# Patient Record
Sex: Male | Born: 1970 | Race: White | Hispanic: No | Marital: Single | Smoking: Never smoker
Health system: Southern US, Community
[De-identification: ages and names within clinical notes are randomized; demographics above are authoritative.]

## PROBLEM LIST (undated history)

## (undated) DIAGNOSIS — T7840XA Allergy, unspecified, initial encounter: Secondary | ICD-10-CM

## (undated) DIAGNOSIS — M1990 Unspecified osteoarthritis, unspecified site: Secondary | ICD-10-CM

## (undated) HISTORY — DX: Unspecified osteoarthritis, unspecified site: M19.90

## (undated) HISTORY — DX: Allergy, unspecified, initial encounter: T78.40XA

---

## 2003-10-09 ENCOUNTER — Emergency Department (HOSPITAL_COMMUNITY): Admission: EM | Admit: 2003-10-09 | Discharge: 2003-10-10 | Payer: Self-pay | Admitting: Emergency Medicine

## 2021-08-14 ENCOUNTER — Ambulatory Visit (INDEPENDENT_AMBULATORY_CARE_PROVIDER_SITE_OTHER): Payer: 59

## 2021-08-14 ENCOUNTER — Ambulatory Visit
Admission: EM | Admit: 2021-08-14 | Discharge: 2021-08-14 | Disposition: A | Discharge status: Home / Self Care | Payer: 59 | Attending: Internal Medicine | Admitting: Internal Medicine

## 2021-08-14 DIAGNOSIS — M25561 Pain in right knee: Secondary | ICD-10-CM

## 2021-08-14 NOTE — ED Provider Notes (Signed)
[name]    CSN: [id number] Arrival date & time: 08/14/21  1311      History   Chief Complaint Chief Complaint  Patient presents with   right knee pain    HPI Jaime Walters is a 51 y.o. male.   Patient presents with right knee pain that has been intermittent over the past 3 weeks.  Denies any apparent recent injury.  Patient has taken 2 doses of ibuprofen since symptoms started with minimal improvement.  Denies any numbness or tingling.  Pain is exacerbated with movement and bearing weight.  Patient does report that he fractured that leg multiple years prior so he does have a rod in his leg.    History reviewed. No pertinent past medical history.  There are no problems to display for this patient.   History reviewed. No pertinent surgical history.     Home Medications    Prior to Admission medications   Not on File    Family History History reviewed. No pertinent family history.  Social History Social History   Tobacco Use   Smoking status: Never   Smokeless tobacco: Never     Allergies   Patient has no allergy information on record.   Review of Systems Review of Systems Per HPI  Physical Exam Triage Vital Signs ED Triage Vitals [08/14/21 1425]  Enc Vitals Group     BP (!) 146/86     Pulse Rate 100     Resp 18     Temp 98 F (36.7 C)     Temp Source Oral     SpO2 96 %     Weight      Height      Head Circumference      Peak Flow      Pain Score 0     Pain Loc      Pain Edu?      Excl. in GC?    No data found.  Updated Vital Signs BP (!) 146/86 (BP Location: Left Arm)   Pulse 100   Temp 98 F (36.7 C) (Oral)   Resp 18   SpO2 96%   Visual Acuity Right Eye Distance:   Left Eye Distance:   Bilateral Distance:    Right Eye Near:   Left Eye Near:    Bilateral Near:     Physical Exam Constitutional:      General: He is not in acute distress.    Appearance: Normal appearance. He is not toxic-appearing or  diaphoretic.  HENT:     Head: Normocephalic and atraumatic.  Eyes:     Extraocular Movements: Extraocular movements intact.     Conjunctiva/sclera: Conjunctivae normal.  Pulmonary:     Effort: Pulmonary effort is normal.  Musculoskeletal:     Comments: No tenderness to palpation generalized throughout right knee.  No obvious swelling or discoloration noted.  No crepitus noted.  Patient has full range of motion of knee.  Neurovascular intact  Neurological:     General: No focal deficit present.     Mental Status: He is alert and oriented to person, place, and time. Mental status is at baseline.  Psychiatric:        Mood and Affect: Mood normal.        Behavior: Behavior normal.        Thought Content: Thought content normal.        Judgment: Judgment normal.     UC Treatments / Results  Labs (all  labs ordered are listed, but only abnormal results are displayed) Labs Reviewed - No data to display  EKG   Radiology DG Knee Complete 4 Views Right  Result Date: 08/14/2021 CLINICAL DATA:  Right knee pain for the past week.  No injury. EXAM: RIGHT KNEE - COMPLETE 4+ VIEW COMPARISON:  None Available. FINDINGS: No acute fracture or dislocation. Incompletely visualized healed femoral shaft fracture status post intramedullary rod placement. No joint effusion. Mild medial compartment joint space narrowing with small marginal osteophytes. Soft tissues are unremarkable. IMPRESSION: 1. No acute osseous abnormality. 2. Mild medial compartment osteoarthritis. Electronically Signed   By: Obie Dredge M.D.   On: 08/14/2021 15:18    Procedures Procedures (including critical care time)  Medications Ordered in UC Medications - No data to display  Initial Impression / Assessment and Plan / UC Course  I have reviewed the triage vital signs and the nursing notes.  Pertinent labs & imaging results that were available during my care of the patient were reviewed by me and considered in my medical  decision making (see chart for details).     Right knee x-ray showing mild medial compartment osteoarthritis.  This could be contributing to patient's knee pain.  Advised anti-inflammatory medications, ice application, supportive care with patient.  Patient was offered prednisone steroid but declined as he wishes to take NSAIDs.  Patient to follow-up with provided contact information for orthopedist if pain persists or worsens.  Patient was given strict return precautions.  Patient verbalized understanding and was agreeable with plan. Final Clinical Impressions(s) / UC Diagnoses   Final diagnoses:  Acute pain of right knee     Discharge Instructions      You may take ibuprofen for your knee pain.  Also apply ice to area.  Follow-up with orthopedist.    ED Prescriptions   None    PDMP not reviewed this encounter.   Gustavus Bryant, [place] 08/14/21 [phone]

## 2021-08-14 NOTE — Discharge Instructions (Signed)
You may take ibuprofen for your knee pain.  Also apply ice to area.  Follow-up with orthopedist.

## 2021-08-14 NOTE — ED Triage Notes (Signed)
Pt c/o right knee injury denies injury x 1 week

## 2021-10-20 ENCOUNTER — Ambulatory Visit
Admission: EM | Admit: 2021-10-20 | Discharge: 2021-10-20 | Disposition: A | Discharge status: Home / Self Care | Payer: 59 | Attending: Physician Assistant | Admitting: Physician Assistant

## 2021-10-20 DIAGNOSIS — R310 Gross hematuria: Secondary | ICD-10-CM | POA: Diagnosis present

## 2021-10-20 LAB — POCT URINALYSIS DIP (MANUAL ENTRY)
Bilirubin, UA: NEGATIVE
Glucose, UA: NEGATIVE mg/dL
Ketones, POC UA: NEGATIVE mg/dL
Leukocytes, UA: NEGATIVE
Nitrite, UA: NEGATIVE
Protein Ur, POC: 100 mg/dL — AB
Spec Grav, UA: >=1.03 — AB (ref 1.010–1.025)
Urobilinogen, UA: 0.2 E.U./dL
pH, UA: 5.5 (ref 5.0–8.0)

## 2021-10-20 NOTE — ED Provider Notes (Signed)
[name]    CSN: [id number] Arrival date & time: 10/20/21  1201      History   Chief Complaint Chief Complaint  Patient presents with   Hematuria    HPI Jaime Walters is a 51 y.o. male.   Patient presents today accompanied by his wife who provide the majority of history.  Reports for the last week he has had abnormal urine color.  Reports that he has had frank hematuria as well as occasional black flakes and white flakes in his urine.  He denies any pain, dysuria, back pain, nausea, vomiting, swelling.  He denies history of UTI or urological condition.  Denies any recent catheterization or trauma.  He does not take any blood thinning medications.  He denies any recent illness.  Denies history of kidney disease.  He reports that symptoms have gradually been improving but he continues to have intermittent symptoms prompting evaluation today.  He has not seen a urologist in the past.  Denies history of nephrolithiasis.  Denies any flank pain, abdominal pain, fever.    History reviewed. No pertinent past medical history.  There are no problems to display for this patient.   History reviewed. No pertinent surgical history.     Home Medications    Prior to Admission medications   Not on File    Family History History reviewed. No pertinent family history.  Social History Social History   Tobacco Use   Smoking status: Never   Smokeless tobacco: Never     Allergies   Patient has no allergy information on record.   Review of Systems Review of Systems  Constitutional:  Negative for activity change, appetite change, fatigue and fever.  Gastrointestinal:  Negative for abdominal pain, diarrhea, nausea and vomiting.  Genitourinary:  Positive for hematuria. Negative for dysuria, flank pain, frequency, penile discharge, penile pain, penile swelling and urgency.  Musculoskeletal:  Negative for arthralgias, back pain and myalgias.     Physical  Exam Triage Vital Signs ED Triage Vitals [10/20/21 1214]  Enc Vitals Group     BP 125/85     Pulse Rate 80     Resp 18     Temp 98 F (36.7 C)     Temp Source Oral     SpO2 96 %     Weight      Height      Head Circumference      Peak Flow      Pain Score 0     Pain Loc      Pain Edu?      Excl. in GC?    No data found.  Updated Vital Signs BP 125/85 (BP Location: Left Arm)   Pulse 80   Temp 98 F (36.7 C) (Oral)   Resp 18   SpO2 96%   Visual Acuity Right Eye Distance:   Left Eye Distance:   Bilateral Distance:    Right Eye Near:   Left Eye Near:    Bilateral Near:     Physical Exam Vitals reviewed.  Constitutional:      General: He is awake.     Appearance: Normal appearance. He is well-developed. He is not ill-appearing.     Comments: Very pleasant male appears stated age in no acute distress sitting comfortably in exam room  HENT:     Head: Normocephalic and atraumatic.     Mouth/Throat:     Pharynx: No oropharyngeal exudate, posterior oropharyngeal erythema or uvula swelling.  Cardiovascular:     Rate and Rhythm: Normal rate and regular rhythm.     Heart sounds: Normal heart sounds, S1 normal and S2 normal. No murmur heard. Pulmonary:     Effort: Pulmonary effort is normal.     Breath sounds: Normal breath sounds. No stridor. No wheezing, rhonchi or rales.     Comments: Clear to auscultation bilaterally Abdominal:     General: Bowel sounds are normal.     Palpations: Abdomen is soft.     Tenderness: There is no abdominal tenderness. There is no right CVA tenderness, left CVA tenderness, guarding or rebound.  Neurological:     Mental Status: He is alert.  Psychiatric:        Behavior: Behavior is cooperative.      UC Treatments / Results  Labs (all labs ordered are listed, but only abnormal results are displayed) Labs Reviewed  POCT URINALYSIS DIP (MANUAL ENTRY) - Abnormal; Notable for the following components:      Result Value   Color, UA  orange (*)    Clarity, UA cloudy (*)    Spec Grav, UA >=1.030 (*)    Blood, UA large (*)    Protein Ur, POC =100 (*)    All other components within normal limits  URINE CULTURE  CBC WITH DIFFERENTIAL/PLATELET  COMPREHENSIVE METABOLIC PANEL  MICROALBUMIN / CREATININE URINE RATIO    EKG   Radiology No results found.  Procedures Procedures (including critical care time)  Medications Ordered in UC Medications - No data to display  Initial Impression / Assessment and Plan / UC Course  I have reviewed the triage vital signs and the nursing notes.  Pertinent labs & imaging results that were available during my care of the patient were reviewed by me and considered in my medical decision making (see chart for details).     UA showed abnormal color, large hemoglobin, proteinuria without leukocytes or nitrates.  Unfortunately, we do not have ability to review urine under microscope in urgent care.  Discussed that given his clinical presentation he should follow-up with urology.  I do have a slight concern for glomerular bleeding given "flakes" in the urine and proteinuria.  CBC, CMP, ACR obtained today-results pending.  If these are significantly abnormal will recommend nephrology follow-up rather than urology.  Discussed that he should continue pushing fluids.  He is to follow-up with urology tomorrow; recommended he call to Schedule an appointment unless his blood work is abnormal and we need to change his plan.  If he develops any worsening symptoms including gross hematuria, shortness of breath, weakness, back pain, leg swelling he needs to go to the emergency room immediately to which he expressed understanding.  Final Clinical Impressions(s) / UC Diagnoses   Final diagnoses:  Gross hematuria     Discharge Instructions      I will contact you with your lab work if anything is abnormal and we need to change our plan.  Make sure you are drinking lots of fluid.  Call and schedule  an appointment as soon as possible with urology.  If anything worsens and you have lots of blood in your urine, shortness of breath, weakness, back pain, leg swelling, nausea, vomiting you need to go to the emergency room immediately.     ED Prescriptions   None    PDMP not reviewed this encounter.   Jeani Hawking, PA-C 10/20/21 1302

## 2021-10-20 NOTE — Discharge Instructions (Signed)
I will contact you with your lab work if anything is abnormal and we need to change our plan.  Make sure you are drinking lots of fluid.  Call and schedule an appointment as soon as possible with urology.  If anything worsens and you have lots of blood in your urine, shortness of breath, weakness, back pain, leg swelling, nausea, vomiting you need to go to the emergency room immediately.

## 2021-10-20 NOTE — ED Triage Notes (Signed)
Pt c/o both black and red colored urine and "flakes of whatever" in urine. States it comes and goes for the last week.

## 2021-10-21 LAB — URINE CULTURE: Culture: NO GROWTH

## 2021-10-22 LAB — COMPREHENSIVE METABOLIC PANEL
ALT: 24 IU/L (ref 0–44)
AST: 23 IU/L (ref 0–40)
Albumin/Globulin Ratio: 2.1 (ref 1.2–2.2)
Albumin: 4.6 g/dL (ref 4.1–5.1)
Alkaline Phosphatase: 92 IU/L (ref 44–121)
BUN/Creatinine Ratio: 22 — ABNORMAL HIGH (ref 9–20)
BUN: 21 mg/dL (ref 6–24)
Bilirubin Total: 0.4 mg/dL (ref 0.0–1.2)
CO2: 21 mmol/L (ref 20–29)
Calcium: 9.4 mg/dL (ref 8.7–10.2)
Chloride: 102 mmol/L (ref 96–106)
Creatinine, Ser: 0.97 mg/dL (ref 0.76–1.27)
Globulin, Total: 2.2 g/dL (ref 1.5–4.5)
Glucose: 107 mg/dL — ABNORMAL HIGH (ref 70–99)
Potassium: 3.9 mmol/L (ref 3.5–5.2)
Sodium: 138 mmol/L (ref 134–144)
Total Protein: 6.8 g/dL (ref 6.0–8.5)
eGFR: 96 mL/min/{1.73_m2} (ref >59)

## 2021-10-22 LAB — CBC WITH DIFFERENTIAL/PLATELET
Basophils Absolute: 0.1 10*3/uL (ref 0.0–0.2)
Basos: 1 %
EOS (ABSOLUTE): 0.3 10*3/uL (ref 0.0–0.4)
Eos: 2 %
Hematocrit: 44.3 % (ref 37.5–51.0)
Hemoglobin: 14.9 g/dL (ref 13.0–17.7)
Immature Grans (Abs): 0 10*3/uL (ref 0.0–0.1)
Immature Granulocytes: 0 %
Lymphocytes Absolute: 4.3 10*3/uL — ABNORMAL HIGH (ref 0.7–3.1)
Lymphs: 37 %
MCH: 30 pg (ref 26.6–33.0)
MCHC: 33.6 g/dL (ref 31.5–35.7)
MCV: 89 fL (ref 79–97)
Monocytes Absolute: 0.8 10*3/uL (ref 0.1–0.9)
Monocytes: 7 %
Neutrophils Absolute: 6.1 10*3/uL (ref 1.4–7.0)
Neutrophils: 53 %
Platelets: 269 10*3/uL (ref 150–450)
RBC: 4.96 x10E6/uL (ref 4.14–5.80)
RDW: 13.4 % (ref 11.6–15.4)
WBC: 11.6 10*3/uL — ABNORMAL HIGH (ref 3.4–10.8)

## 2022-04-16 ENCOUNTER — Emergency Department (HOSPITAL_BASED_OUTPATIENT_CLINIC_OR_DEPARTMENT_OTHER)
Admission: EM | Admit: 2022-04-16 | Discharge: 2022-04-16 | Disposition: A | Discharge status: Home / Self Care | Payer: 59 | Attending: Emergency Medicine | Admitting: Emergency Medicine

## 2022-04-16 ENCOUNTER — Encounter (HOSPITAL_BASED_OUTPATIENT_CLINIC_OR_DEPARTMENT_OTHER): Payer: Self-pay

## 2022-04-16 ENCOUNTER — Other Ambulatory Visit: Payer: Self-pay

## 2022-04-16 DIAGNOSIS — Z23 Encounter for immunization: Secondary | ICD-10-CM | POA: Insufficient documentation

## 2022-04-16 DIAGNOSIS — W208XXA Other cause of strike by thrown, projected or falling object, initial encounter: Secondary | ICD-10-CM | POA: Diagnosis not present

## 2022-04-16 DIAGNOSIS — S0101XA Laceration without foreign body of scalp, initial encounter: Secondary | ICD-10-CM | POA: Insufficient documentation

## 2022-04-16 DIAGNOSIS — S0990XA Unspecified injury of head, initial encounter: Secondary | ICD-10-CM | POA: Diagnosis present

## 2022-04-16 MED ORDER — LIDOCAINE-EPINEPHRINE-TETRACAINE (LET) TOPICAL GEL: 3.0000 mL | Freq: Once | TOPICAL | Status: DC

## 2022-04-16 MED ORDER — TETANUS-DIPHTH-ACELL PERTUSSIS 5-2.5-18.5 LF-MCG/0.5 IM SUSY
0.5000 mL | PREFILLED_SYRINGE | Freq: Once | INTRAMUSCULAR | Status: AC
  Administered 2022-04-16: 0.5 mL via INTRAMUSCULAR
  Filled 2022-04-16: qty 0.5

## 2022-04-16 MED ORDER — LIDOCAINE-EPINEPHRINE (PF) 2 %-1:200000 IJ SOLN
10.0000 mL | Freq: Once | INTRAMUSCULAR | Status: AC
  Administered 2022-04-16: 10 mL
  Filled 2022-04-16: qty 20

## 2022-04-16 NOTE — ED Triage Notes (Signed)
Patient presents with laceration on the left side of head. Reports having and 69ft object fall on his head. Patients reports neck pain as 1/10. Patient reports no LOC. No blood thinners. Last tetanus shot unknown.

## 2022-04-16 NOTE — ED Provider Notes (Signed)
[facility] Provider Note   CSN: [id number] Arrival date & time: 04/16/22  2008     History  Chief Complaint  Patient presents with   Head Laceration    Jaime Walters is a 52 y.o. male.  Patient with no pertinent past medical history presents today with complaints of head laceration.  He states that same occurred earlier today when a cable reel fell off a truck and struck him in the head.  He did not lose consciousness.  He is not on blood thinners.  He denies headache, vision changes, nausea, or vomiting.  He is unsure of his last tetanus.  The history is provided by the patient. No language interpreter was used.  Head Laceration       Home Medications Prior to Admission medications   Not on File      Allergies    Patient has no known allergies.    Review of Systems   Review of Systems  Skin:  Positive for wound.  All other systems reviewed and are negative.   Physical Exam Updated Vital Signs BP (!) 138/97   Pulse 84   Temp 98.6 F (37 C) (Oral)   Resp 16   Ht 5\' 9"  (1.753 m)   Wt (!) 138.3 kg   SpO2 97%   BMI 45.04 kg/m  Physical Exam Vitals and nursing note reviewed.  Constitutional:      General: He is not in acute distress.    Appearance: Normal appearance. He is normal weight. He is not ill-appearing, toxic-appearing or diaphoretic.  HENT:     Head: Normocephalic.     Comments: 6 cm linear laceration noted to the left parietal region of the head.  No active bleeding.  No signs of foreign body.  No crepitus or deformity.  No Battle sign or raccoon eyes Eyes:     Extraocular Movements: Extraocular movements intact.     Pupils: Pupils are equal, round, and reactive to light.  Neck:     Comments: No tenderness to palpation of the cervical spine Cardiovascular:     Rate and Rhythm: Normal rate.  Pulmonary:     Effort: Pulmonary effort is normal. No respiratory distress.  Musculoskeletal:         General: Normal range of motion.     Cervical back: Normal range of motion and neck supple.  Skin:    General: Skin is warm and dry.  Neurological:     General: No focal deficit present.     Mental Status: He is alert and oriented to person, place, and time.     Gait: Gait normal.  Psychiatric:        Mood and Affect: Mood normal.        Behavior: Behavior normal.     ED Results / Procedures / Treatments   Labs (all labs ordered are listed, but only abnormal results are displayed) Labs Reviewed - No data to display  EKG None  Radiology No results found.  Procedures .Marland KitchenLaceration Repair  Date/Time: 04/16/2022 10:08 PM  Performed by: Bud Face, PA-C Authorized by: Bud Face, PA-C   Consent:    Consent obtained:  Verbal   Consent given by:  Patient   Risks, benefits, and alternatives were discussed: yes     Risks discussed:  Infection, need for additional repair, nerve damage, poor wound healing, poor cosmetic result, pain, retained foreign body, tendon damage and vascular damage   Alternatives discussed:  No treatment, delayed treatment, observation and referral Universal protocol:    Patient identity confirmed:  Verbally with patient Anesthesia:    Anesthesia method:  Local infiltration   Local anesthetic:  Lidocaine 2% WITH epi Laceration details:    Location:  Scalp   Scalp location:  L parietal   Length (cm):  6   Depth (mm):  2 Exploration:    Hemostasis achieved with:  Direct pressure and epinephrine   Imaging outcome: foreign body not noted     Wound exploration: wound explored through full range of motion and entire depth of wound visualized   Treatment:    Area cleansed with:  Povidone-iodine and saline   Amount of cleaning:  Standard   Irrigation solution:  Sterile saline   Irrigation volume:  500 ml   Irrigation method:  Pressure wash Skin repair:    Repair method:  Staples   Number of staples:  8 Approximation:    Approximation:   Close Repair type:    Repair type:  Simple Post-procedure details:    Dressing:  Antibiotic ointment   Procedure completion:  Tolerated well, no immediate complications     Medications Ordered in ED Medications  lidocaine-EPINEPHrine-tetracaine (LET) topical gel (3 mLs Topical Not Given 04/16/22 2206)  lidocaine-EPINEPHrine (XYLOCAINE W/EPI) 2 %-1:200000 (PF) injection 10 mL (10 mLs Infiltration Given by Other 04/16/22 2205)  Tdap (BOOSTRIX) injection 0.5 mL (0.5 mLs Intramuscular Given 04/16/22 2123)    ED Course/ Medical Decision Making/ A&P                             Medical Decision Making Risk Prescription drug management.   Patient presents today with complaints of head laceration that occurred earlier today.  He is afebrile, nontoxic-appearing, and in no acute distress with reassuring vital signs.  He is also alert and oriented and neurologically intact without focal deficits.  He is not anticoagulated.  Following canadian head ct criteria, patient does not require head CT.  Discussed with patient who is understanding and in agreement.  He does have a laceration that was repaired per above procedure.  Pressure irrigation performed. Wound explored and base of wound visualized in a bloodless field without evidence of foreign body.  Laceration occurred < 8 hours prior to repair which was well tolerated. Tdap updated.  Pt has no comorbidities to effect normal wound healing. Pt discharged  without antibiotics.  Discussed staple home care with patient and answered questions. Pt to follow-up for wound check and staple removal in 7 days; they are to return to the ED sooner for signs of infection. Pt is hemodynamically stable with no complaints prior to dc.  Patient understanding and amenable with plan, educated on red flag symptoms that would prompt immediate return.  Patient discharged in stable condition.   Final Clinical Impression(s) / ED Diagnoses Final diagnoses:  Laceration of scalp,  initial encounter    Rx / DC Orders ED Discharge Orders     None     An After Visit Summary was printed and given to the patient.     Nestor Lewandowsky 04/16/22 2214    Blanchie Dessert, MD 04/17/22 [phone]

## 2022-04-16 NOTE — Discharge Instructions (Signed)
You were seen in the emergency department for your head laceration.   We have closed your laceration(s) with staples. These need to be removed in 7 days. This can be done at any doctor's office, urgent care, or emergency department.   If any of the staples come out before it is time for removal, that is okay. Make sure to keep the area as clean and dry as possible. You can let warm soapy warm run over the area, but do NOT scrub it.   Watch out for signs of infection, like we discussed, including: increased redness, tenderness, or drainage of pus from the area. If this happens and you have not been prescribed an antibiotic, please seek medical attention for possible infection.   You can take over the counter pain medicine like ibuprofen or tylenol as needed.   Return if development of any new or worsening symptoms.

## 2022-04-16 NOTE — ED Notes (Signed)
Reviewed AVS/discharge instruction with patient. Time allotted for and all questions answered. Patient is agreeable for d/c and escorted to ed exit by staff.  

## 2023-03-24 ENCOUNTER — Ambulatory Visit
Admission: EM | Admit: 2023-03-24 | Discharge: 2023-03-24 | Disposition: A | Discharge status: Home / Self Care | Payer: 59 | Attending: Internal Medicine | Admitting: Internal Medicine

## 2023-03-24 DIAGNOSIS — Z758 Other problems related to medical facilities and other health care: Secondary | ICD-10-CM | POA: Diagnosis not present

## 2023-03-24 DIAGNOSIS — R42 Dizziness and giddiness: Secondary | ICD-10-CM

## 2023-03-24 DIAGNOSIS — H6503 Acute serous otitis media, bilateral: Secondary | ICD-10-CM

## 2023-03-24 LAB — POCT FASTING CBG KUC MANUAL ENTRY: POCT Glucose (KUC): 114 mg/dL — AB (ref 70–99)

## 2023-03-24 MED ORDER — MECLIZINE HCL 12.5 MG PO TABS: 12.5000 mg | ORAL_TABLET | Freq: Three times a day (TID) | ORAL | 0 refills | Status: AC | PRN

## 2023-03-24 NOTE — Discharge Instructions (Addendum)
 May take over the counter decongestant, allergy med of choice, flonase nasal spray as label directed, take meclizine  as directed. Drink plenty of water. If you have new or worsening issues(chest pain,shortness of breath, or worsening dizziness/symptoms), go to ER for new or worsening issues.   Please get established with PCP of your choice(need physical,labs,etc).

## 2023-03-24 NOTE — ED Triage Notes (Signed)
"  I keep getting dizzy for the last few days, mainly when I lay down".  This is not something known or seen for in past. Currently have "sinus pressure across my forehead". No fever.

## 2023-03-24 NOTE — ED Provider Notes (Signed)
 [name]    CSN: [id number] Arrival date & time: 03/24/23  0809      History   Chief Complaint Chief Complaint  Patient presents with   Dizziness    HPI Jaime Walters is a 53 y.o. male.   53 year old male pt, Jaime Walters, presents to urgent care for evaluation of sinus pressure and dizziness x 3 days. Pt denies any stated medical history. Pt denies smoking,drinking,or alcohol use.   BS checked in office 114  The history is provided by the patient. No language interpreter was used.  Dizziness   History reviewed. No pertinent past medical history.  Patient Active Problem List   Diagnosis Date Noted   Does not have primary care provider 03/24/2023   Vertigo 03/24/2023   Bilateral acute serous otitis media 03/24/2023    History reviewed. No pertinent surgical history.     Home Medications    Prior to Admission medications   Medication Sig Start Date End Date Taking? Authorizing Provider  meclizine  (ANTIVERT ) 12.5 MG tablet Take 1 tablet (12.5 mg total) by mouth 3 (three) times daily as needed for up to 5 days for dizziness. 03/24/23 03/29/23 Yes Anadelia Kintz, Rilla, NP    Family History History reviewed. No pertinent family history.  Social History Social History   Tobacco Use   Smoking status: Never   Smokeless tobacco: Never  Vaping Use   Vaping status: Never Used  Substance Use Topics   Alcohol use: Not Currently   Drug use: Never     Allergies   Patient has no known allergies.   Review of Systems Review of Systems  HENT:  Positive for congestion and sinus pressure. Negative for ear discharge and ear pain.   Neurological:  Positive for dizziness.  All other systems reviewed and are negative.    Physical Exam Triage Vital Signs ED Triage Vitals [03/24/23 0816]  Encounter Vitals Group     BP      Systolic BP Percentile      Diastolic BP Percentile      Pulse Rate 86     Resp 20     Temp 98.1 F (36.7 C)     Temp  Source Oral     SpO2 98 %     Weight      Height      Head Circumference      Peak Flow      Pain Score      Pain Loc      Pain Education      Exclude from Growth Chart    Orthostatic VS for the past 24 hrs:  BP- Lying Pulse- Lying BP- Standing at 0 minutes Pulse- Standing at 0 minutes  03/24/23 0816 (!) 173/101 83 141/87 98    Updated Vital Signs BP 125/81 Comment: See Orthostatic V/S  Pulse 86   Temp 98.1 F (36.7 C) (Oral)   Resp 20   Ht 5' 10 (1.778 m)   Wt (!) 315 lb (142.9 kg)   SpO2 98%   BMI 45.20 kg/m   Visual Acuity Right Eye Distance:   Left Eye Distance:   Bilateral Distance:    Right Eye Near:   Left Eye Near:    Bilateral Near:     Physical Exam Vitals and nursing note reviewed.  Constitutional:      General: He is not in acute distress.    Appearance: He is well-developed. He is not ill-appearing or toxic-appearing.  HENT:  Head: Normocephalic.     Ears:     Comments: Dull TM bilateral    Nose: Mucosal edema and congestion present.     Mouth/Throat:     Lips: Pink.     Mouth: Mucous membranes are moist.     Pharynx: Oropharynx is clear. Uvula midline.  Eyes:     General: Lids are normal.     Conjunctiva/sclera: Conjunctivae normal.     Pupils: Pupils are equal, round, and reactive to light.  Cardiovascular:     Rate and Rhythm: Normal rate and regular rhythm.     Heart sounds: Normal heart sounds.  Pulmonary:     Effort: Pulmonary effort is normal. No respiratory distress.     Breath sounds: Normal breath sounds and air entry. No decreased breath sounds or wheezing.  Abdominal:     General: There is no distension.     Palpations: Abdomen is soft.  Musculoskeletal:        General: Normal range of motion.     Cervical back: Normal range of motion.  Skin:    General: Skin is warm and dry.     Findings: No rash.  Neurological:     General: No focal deficit present.     Mental Status: He is alert and oriented to person, place, and  time.     GCS: GCS eye subscore is 4. GCS verbal subscore is 5. GCS motor subscore is 6.     Cranial Nerves: No cranial nerve deficit.     Sensory: No sensory deficit.  Psychiatric:        Speech: Speech normal.        Behavior: Behavior normal. Behavior is cooperative.      UC Treatments / Results  Labs (all labs ordered are listed, but only abnormal results are displayed) Labs Reviewed  POCT FASTING CBG [facility] MANUAL ENTRY - Abnormal; Notable for the following components:      Result Value   POCT Glucose ([facility]) 114 (*)    All other components within normal limits    EKG   Radiology No results found.  Procedures Procedures (including critical care time)  Medications Ordered in UC Medications - No data to display  Initial Impression / Assessment and Plan / UC Course  I have reviewed the triage vital signs and the nursing notes.  Pertinent labs & imaging results that were available during my care of the patient were reviewed by me and considered in my medical decision making (see chart for details).    Discussed exam findings and plan of care with patient, strict go to ER precautions given.   Patient verbalized understanding to this provider.  Ddx: Vertigo, serous otitis, allergies Final Clinical Impressions(s) / UC Diagnoses   Final diagnoses:  Does not have primary care provider  Vertigo  Bilateral acute serous otitis media, recurrence not specified     Discharge Instructions       May take over the counter decongestant, flonase nasal spray as label directed, take meclizine  as directed. Drink plenty of water. If you have new or worsening issues(chest pain,shortness of breath, or worsening dizziness/symptoms), go to ER for new or worsening issues.   Please get established with PCP of your choice(need physical,labs,etc).     ED Prescriptions     Medication Sig Dispense Auth. Provider   meclizine  (ANTIVERT ) 12.5 MG tablet Take 1 tablet (12.5 mg total) by  mouth 3 (three) times daily as needed for up to 5 days for dizziness.  15 tablet Sukari Grist, Rilla, NP      PDMP not reviewed this encounter.   Aminta Rilla, NP 03/24/23 [phone]

## 2023-06-04 IMAGING — DX DG KNEE COMPLETE 4+V*R*
4 series · 4 of 4 positions shown · non-contrast
Comparison: None Available.

CLINICAL DATA: Right knee pain for the past week.  No injury.

EXAM:
RIGHT KNEE - COMPLETE 4+ VIEW

[knee standing ap]
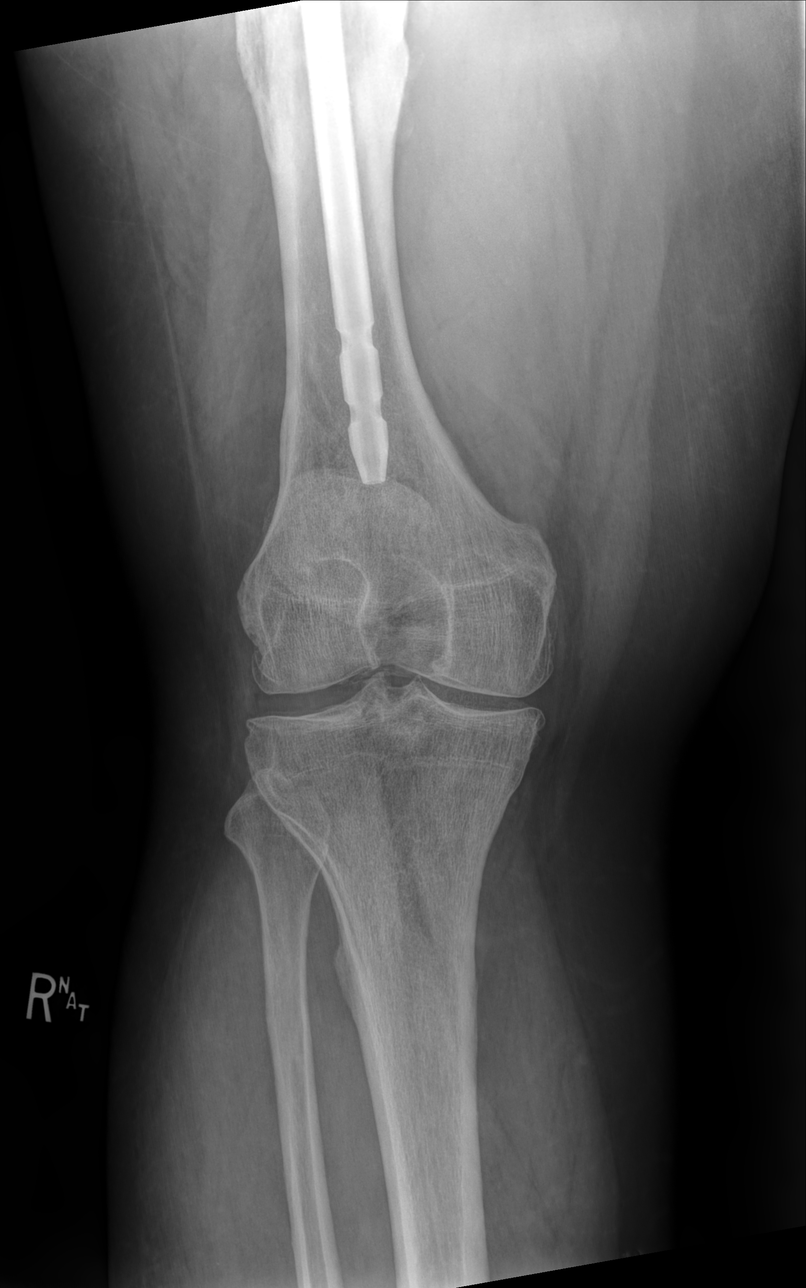

[knee lmo]
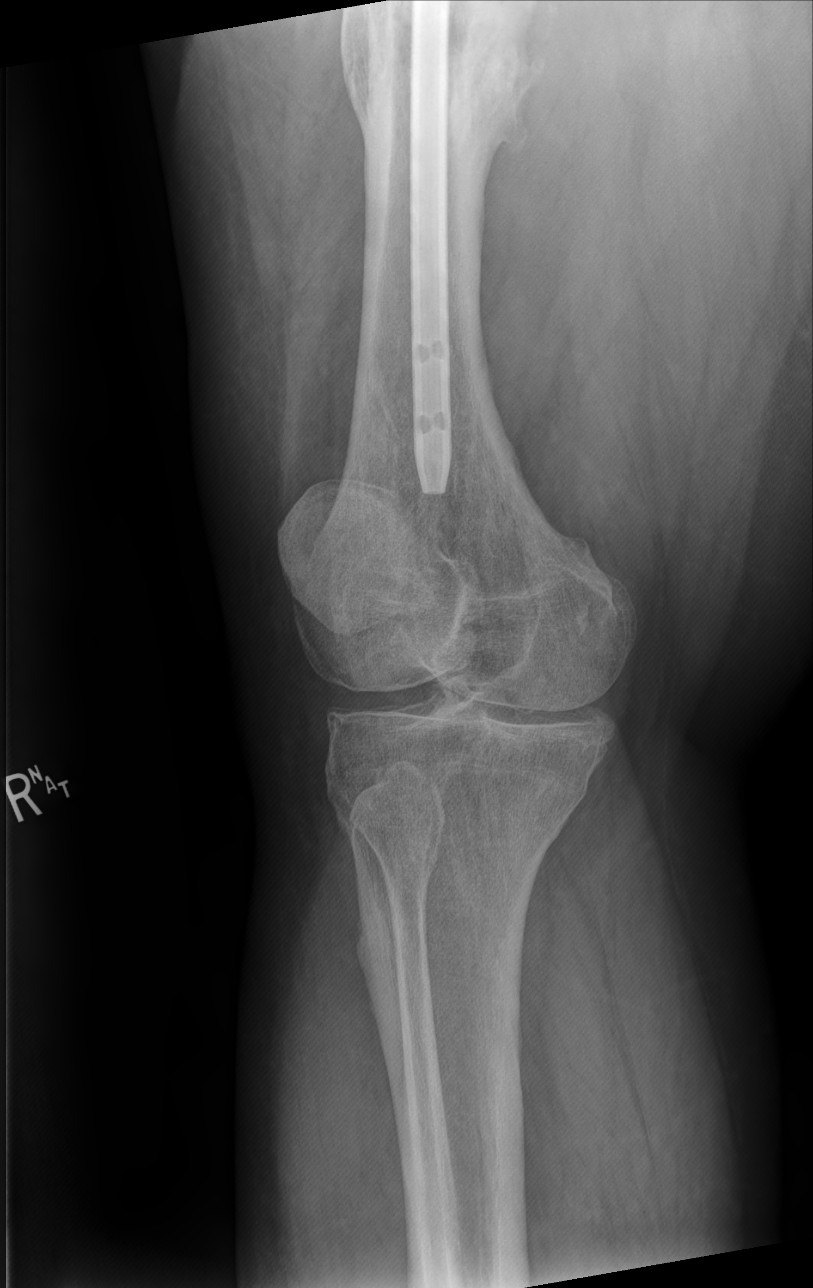

[knee mlo]
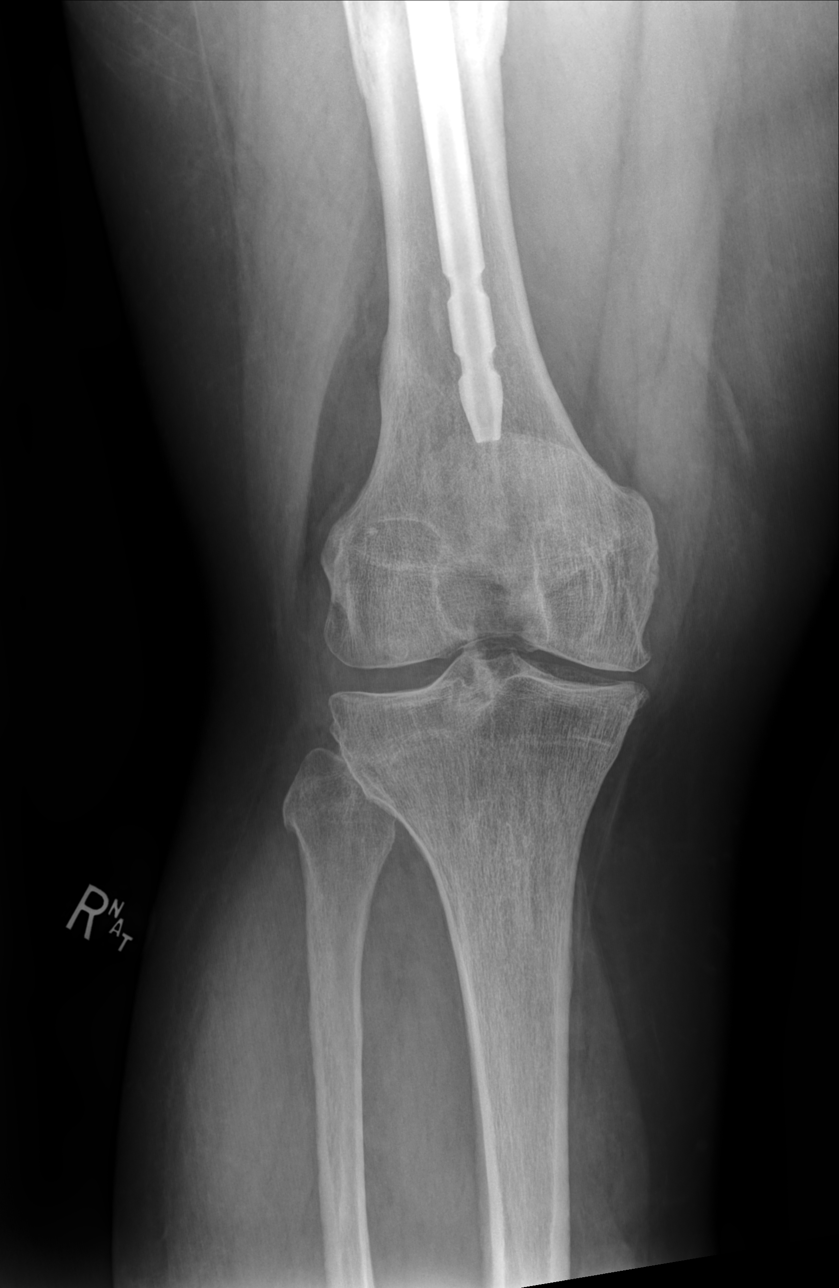

[knee standing lat]
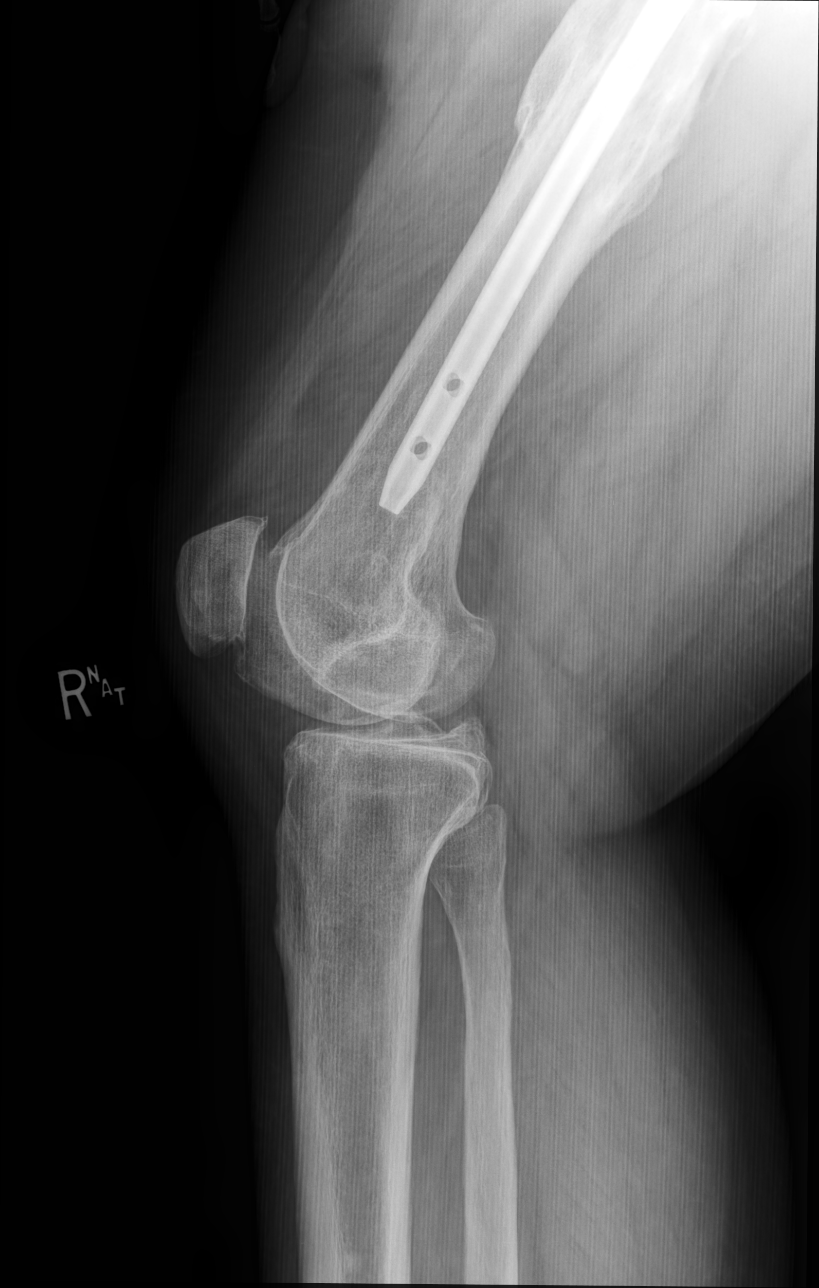

[4 of 4 positions shown; findings below may reference images not displayed]

FINDINGS: No acute fracture or dislocation. Incompletely visualized healed
femoral shaft fracture status post intramedullary rod placement. No
joint effusion. Mild medial compartment joint space narrowing with
small marginal osteophytes. Soft tissues are unremarkable.
IMPRESSION: 1. No acute osseous abnormality.
2. Mild medial compartment osteoarthritis.

## 2023-06-25 ENCOUNTER — Encounter: Payer: Self-pay | Admitting: Family Medicine

## 2023-06-25 ENCOUNTER — Ambulatory Visit: Payer: 59 | Admitting: Family Medicine

## 2023-06-25 VITALS — BP 159/94 | HR 91 | Ht 70.0 in | Wt 328.1 lb

## 2023-06-25 DIAGNOSIS — Q681 Congenital deformity of finger(s) and hand: Secondary | ICD-10-CM

## 2023-06-25 DIAGNOSIS — R400 Somnolence: Secondary | ICD-10-CM

## 2023-06-25 DIAGNOSIS — R030 Elevated blood-pressure reading, without diagnosis of hypertension: Secondary | ICD-10-CM | POA: Diagnosis not present

## 2023-06-25 DIAGNOSIS — Z7689 Persons encountering health services in other specified circumstances: Secondary | ICD-10-CM

## 2023-06-25 DIAGNOSIS — Z1159 Encounter for screening for other viral diseases: Secondary | ICD-10-CM

## 2023-06-25 DIAGNOSIS — Z125 Encounter for screening for malignant neoplasm of prostate: Secondary | ICD-10-CM
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
## 2023-06-25 NOTE — Patient Instructions (Addendum)
 It was nice to see you today,  We addressed the following topics today: I am going to get some blood test.  I will let you know the results when I get them and we can also discuss them at your next visit - I would like to see you back in 4 weeks - Your fatigue may be due to multiple things but I think part of it is at least due to underlying sleep apnea based on what we discussed.  We will also get some blood test to rule out thyroid anemia and other issues. - For weight loss, the easiest thing to do is try and track of any calories you eat per day and limit it to less than 2000.  Ideally if you limit to less than 1700 cal we will probably lose about a pound a week.  You should also be exercising 30 minutes a day for least 5 days a week.  This exercise can be as simple as walking during her lunch break.  Your blood pressure was elevated both times.  I would like you to document your blood pressure readings at home twice a day for the next 2 weeks and bring results back to us  either when you get your labs or when you see us  again at your next visit.  Have a great day,  Etha Henle, MD

## 2023-06-29 DIAGNOSIS — R400 Somnolence: Secondary | ICD-10-CM | POA: Insufficient documentation

## 2023-06-29 DIAGNOSIS — Q681 Congenital deformity of finger(s) and hand: Secondary | ICD-10-CM | POA: Insufficient documentation

## 2023-06-29 DIAGNOSIS — R030 Elevated blood-pressure reading, without diagnosis of hypertension: Secondary | ICD-10-CM | POA: Insufficient documentation

## 2023-06-29 NOTE — Assessment & Plan Note (Signed)
 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

## 2023-06-29 NOTE — Assessment & Plan Note (Signed)
 Likely due to obstructive sleep apnea.  Has been told he snores, has apneic episodes.  Has daytime somnolence and fatigue and elevated blood pressure today.  After lab work is obtained, we will see patient back in 1 month and can discuss sleep study with him and send in referral.

## 2023-06-29 NOTE — Assessment & Plan Note (Signed)
 BMI 47 and likely contributing to his elevated blood pressure and daytime somnolence, fatigue.

## 2023-06-29 NOTE — Assessment & Plan Note (Signed)
 Has not been diagnosed in the past with hypertension.  OSA and obesity likely main contributors.  Having patient check home readings twice a day and return values to us  prior to next visit.

## 2023-06-30 ENCOUNTER — Other Ambulatory Visit

## 2023-06-30 DIAGNOSIS — R400 Somnolence: Secondary | ICD-10-CM

## 2023-06-30 DIAGNOSIS — Z125 Encounter for screening for malignant neoplasm of prostate: Secondary | ICD-10-CM

## 2023-06-30 DIAGNOSIS — Z1159 Encounter for screening for other viral diseases: Secondary | ICD-10-CM

## 2023-07-01 ENCOUNTER — Encounter: Payer: Self-pay | Admitting: Family Medicine

## 2023-07-01 LAB — IRON,TIBC AND FERRITIN PANEL
Ferritin: 289 ng/mL (ref 30–400)
Iron Saturation: 26 % (ref 15–55)
Iron: 88 ug/dL (ref 38–169)
Total Iron Binding Capacity: 334 ug/dL (ref 250–450)
UIBC: 246 ug/dL (ref 111–343)

## 2023-07-01 LAB — CBC WITH DIFFERENTIAL/PLATELET
Basophils Absolute: 0.1 10*3/uL (ref 0.0–0.2)
Basos: 1 %
EOS (ABSOLUTE): 0.3 10*3/uL (ref 0.0–0.4)
Eos: 3 %
Hematocrit: 46 % (ref 37.5–51.0)
Hemoglobin: 15.4 g/dL (ref 13.0–17.7)
Immature Grans (Abs): 0 10*3/uL (ref 0.0–0.1)
Immature Granulocytes: 0 %
Lymphocytes Absolute: 4 10*3/uL — ABNORMAL HIGH (ref 0.7–3.1)
Lymphs: 39 %
MCH: 29.7 pg (ref 26.6–33.0)
MCHC: 33.5 g/dL (ref 31.5–35.7)
MCV: 89 fL (ref 79–97)
Monocytes Absolute: 0.8 10*3/uL (ref 0.1–0.9)
Monocytes: 8 %
Neutrophils Absolute: 4.9 10*3/uL (ref 1.4–7.0)
Neutrophils: 49 %
Platelets: 280 10*3/uL (ref 150–450)
RBC: 5.18 x10E6/uL (ref 4.14–5.80)
RDW: 13.1 % (ref 11.6–15.4)
WBC: 10 10*3/uL (ref 3.4–10.8)

## 2023-07-01 LAB — LIPID PANEL
Chol/HDL Ratio: 4.8 ratio (ref 0.0–5.0)
Cholesterol, Total: 172 mg/dL (ref 100–199)
HDL: 36 mg/dL — ABNORMAL LOW (ref >39)
LDL Chol Calc (NIH): 120 mg/dL — ABNORMAL HIGH (ref 0–99)
Triglycerides: 86 mg/dL (ref 0–149)
VLDL Cholesterol Cal: 16 mg/dL (ref 5–40)

## 2023-07-01 LAB — COMPREHENSIVE METABOLIC PANEL WITH GFR
ALT: 26 IU/L (ref 0–44)
AST: 24 IU/L (ref 0–40)
Albumin: 4.7 g/dL (ref 3.8–4.9)
Alkaline Phosphatase: 101 IU/L (ref 44–121)
BUN/Creatinine Ratio: 17 (ref 9–20)
BUN: 17 mg/dL (ref 6–24)
Bilirubin Total: 0.5 mg/dL (ref 0.0–1.2)
CO2: 22 mmol/L (ref 20–29)
Calcium: 9.4 mg/dL (ref 8.7–10.2)
Chloride: 98 mmol/L (ref 96–106)
Creatinine, Ser: 1.01 mg/dL (ref 0.76–1.27)
Globulin, Total: 2.7 g/dL (ref 1.5–4.5)
Glucose: 94 mg/dL (ref 70–99)
Potassium: 3.9 mmol/L (ref 3.5–5.2)
Sodium: 137 mmol/L (ref 134–144)
Total Protein: 7.4 g/dL (ref 6.0–8.5)
eGFR: 90 mL/min/{1.73_m2} (ref >59)

## 2023-07-01 LAB — TSH: TSH: 3.03 u[IU]/mL (ref 0.450–4.500)

## 2023-07-01 LAB — HEMOGLOBIN A1C
Est. average glucose Bld gHb Est-mCnc: 128 mg/dL
Hgb A1c MFr Bld: 6.1 % — ABNORMAL HIGH (ref 4.8–5.6)

## 2023-07-01 LAB — HEPATITIS C ANTIBODY: Hep C Virus Ab: NONREACTIVE

## 2023-07-01 LAB — PSA: Prostate Specific Ag, Serum: 0.4 ng/mL (ref 0.0–4.0)

## 2023-07-16 ENCOUNTER — Encounter (HOSPITAL_COMMUNITY): Payer: Self-pay

## 2023-07-23 ENCOUNTER — Ambulatory Visit: Admitting: Family Medicine
